# Patient Record
Sex: Female | Born: 1975 | Race: White | Hispanic: Yes | Marital: Married | Smoking: Never smoker
Health system: Southern US, Community
[De-identification: ages and names within clinical notes are randomized; demographics above are authoritative.]

## PROBLEM LIST (undated history)

## (undated) DIAGNOSIS — I10 Essential (primary) hypertension: Secondary | ICD-10-CM

## (undated) HISTORY — DX: Essential (primary) hypertension: I10

## (undated) HISTORY — PX: CHOLECYSTECTOMY: SHX55

## (undated) HISTORY — PX: HERNIA REPAIR: SHX51

---

## 2001-11-10 ENCOUNTER — Ambulatory Visit (HOSPITAL_COMMUNITY): Admission: RE | Admit: 2001-11-10 | Discharge: 2001-11-10 | Payer: Self-pay | Admitting: *Deleted

## 2002-03-08 ENCOUNTER — Inpatient Hospital Stay (HOSPITAL_COMMUNITY): Admission: AD | Admit: 2002-03-08 | Discharge: 2002-03-08 | Payer: Self-pay | Admitting: *Deleted

## 2002-03-09 ENCOUNTER — Inpatient Hospital Stay (HOSPITAL_COMMUNITY): Admission: AD | Admit: 2002-03-09 | Discharge: 2002-03-11 | Payer: Self-pay | Admitting: Obstetrics and Gynecology

## 2002-03-09 ENCOUNTER — Encounter (INDEPENDENT_AMBULATORY_CARE_PROVIDER_SITE_OTHER): Payer: Self-pay | Admitting: Specialist

## 2007-05-18 ENCOUNTER — Encounter: Admission: RE | Admit: 2007-05-18 | Discharge: 2007-05-18 | Payer: Self-pay | Admitting: Specialist

## 2008-11-27 ENCOUNTER — Ambulatory Visit (HOSPITAL_COMMUNITY): Admission: RE | Admit: 2008-11-27 | Discharge: 2008-11-27 | Payer: Self-pay | Admitting: Family Medicine

## 2009-03-26 ENCOUNTER — Encounter: Payer: Self-pay | Admitting: Obstetrics & Gynecology

## 2009-03-26 ENCOUNTER — Inpatient Hospital Stay (HOSPITAL_COMMUNITY): Admission: AD | Admit: 2009-03-26 | Discharge: 2009-03-28 | Payer: Self-pay | Admitting: Family Medicine

## 2009-03-26 ENCOUNTER — Ambulatory Visit: Payer: Self-pay | Admitting: Obstetrics and Gynecology

## 2009-07-25 ENCOUNTER — Observation Stay (HOSPITAL_COMMUNITY): Admission: EM | Admit: 2009-07-25 | Discharge: 2009-07-26 | Payer: Self-pay | Admitting: Emergency Medicine

## 2010-12-04 LAB — URINE MICROSCOPIC-ADD ON

## 2010-12-04 LAB — COMPREHENSIVE METABOLIC PANEL
ALT: 87 U/L — ABNORMAL HIGH (ref 0–35)
Albumin: 3.8 g/dL (ref 3.5–5.2)
Alkaline Phosphatase: 140 U/L — ABNORMAL HIGH (ref 39–117)
BUN: 10 mg/dL (ref 6–23)
Calcium: 9.4 mg/dL (ref 8.4–10.5)
Glucose, Bld: 124 mg/dL — ABNORMAL HIGH (ref 70–99)
Potassium: 3.5 mEq/L (ref 3.5–5.1)
Sodium: 139 mEq/L (ref 135–145)
Total Protein: 7.5 g/dL (ref 6.0–8.3)

## 2010-12-04 LAB — DIFFERENTIAL
Lymphs Abs: 2.4 10*3/uL (ref 0.7–4.0)
Monocytes Absolute: 0.5 10*3/uL (ref 0.1–1.0)
Monocytes Relative: 7 % (ref 3–12)
Neutro Abs: 4.5 10*3/uL (ref 1.7–7.7)
Neutrophils Relative %: 59 % (ref 43–77)

## 2010-12-04 LAB — URINALYSIS, ROUTINE W REFLEX MICROSCOPIC
Glucose, UA: NEGATIVE mg/dL
Nitrite: NEGATIVE
Specific Gravity, Urine: 1.017 (ref 1.005–1.030)
pH: 5 (ref 5.0–8.0)

## 2010-12-04 LAB — CBC
Hemoglobin: 14.4 g/dL (ref 12.0–15.0)
MCHC: 35.7 g/dL (ref 30.0–36.0)
Platelets: 202 10*3/uL (ref 150–400)
RDW: 13.3 % (ref 11.5–15.5)

## 2010-12-04 LAB — PROTIME-INR
INR: 0.87 (ref 0.00–1.49)
Prothrombin Time: 11.8 seconds (ref 11.6–15.2)

## 2010-12-08 LAB — RPR: RPR Ser Ql: NONREACTIVE

## 2010-12-08 LAB — CBC
Hemoglobin: 14.9 g/dL (ref 12.0–15.0)
RBC: 4.9 MIL/uL (ref 3.87–5.11)
WBC: 12 10*3/uL — ABNORMAL HIGH (ref 4.0–10.5)

## 2014-12-06 ENCOUNTER — Ambulatory Visit
Admission: RE | Admit: 2014-12-06 | Discharge: 2014-12-06 | Disposition: A | Discharge status: Home / Self Care | Payer: No Typology Code available for payment source | Source: Ambulatory Visit | Attending: Family Medicine | Admitting: Family Medicine

## 2014-12-06 ENCOUNTER — Other Ambulatory Visit: Payer: Self-pay | Admitting: Family Medicine

## 2014-12-06 DIAGNOSIS — M25562 Pain in left knee: Secondary | ICD-10-CM

## 2019-11-10 ENCOUNTER — Other Ambulatory Visit: Payer: Self-pay

## 2019-11-10 DIAGNOSIS — Z1231 Encounter for screening mammogram for malignant neoplasm of breast: Secondary | ICD-10-CM

## 2019-11-21 ENCOUNTER — Other Ambulatory Visit: Payer: Self-pay | Admitting: Nurse Practitioner

## 2019-11-21 DIAGNOSIS — Z1231 Encounter for screening mammogram for malignant neoplasm of breast: Secondary | ICD-10-CM

## 2019-12-13 ENCOUNTER — Ambulatory Visit
Admission: RE | Admit: 2019-12-13 | Discharge: 2019-12-13 | Disposition: A | Discharge status: Home / Self Care | Payer: No Typology Code available for payment source | Source: Ambulatory Visit | Attending: Nurse Practitioner | Admitting: Nurse Practitioner

## 2019-12-13 ENCOUNTER — Other Ambulatory Visit: Payer: Self-pay

## 2019-12-13 DIAGNOSIS — Z1231 Encounter for screening mammogram for malignant neoplasm of breast: Secondary | ICD-10-CM

## 2021-02-25 ENCOUNTER — Other Ambulatory Visit: Payer: Self-pay | Admitting: Obstetrics and Gynecology

## 2021-02-25 ENCOUNTER — Other Ambulatory Visit: Payer: Self-pay | Admitting: Obstetrics & Gynecology

## 2021-02-25 DIAGNOSIS — Z1231 Encounter for screening mammogram for malignant neoplasm of breast: Secondary | ICD-10-CM

## 2021-03-14 ENCOUNTER — Ambulatory Visit
Admission: RE | Admit: 2021-03-14 | Discharge: 2021-03-14 | Disposition: A | Discharge status: Home / Self Care | Payer: No Typology Code available for payment source | Source: Ambulatory Visit | Attending: Obstetrics & Gynecology | Admitting: Obstetrics & Gynecology

## 2021-03-14 ENCOUNTER — Other Ambulatory Visit: Payer: Self-pay

## 2021-03-14 DIAGNOSIS — Z1231 Encounter for screening mammogram for malignant neoplasm of breast: Secondary | ICD-10-CM

## 2021-03-18 ENCOUNTER — Other Ambulatory Visit: Payer: Self-pay | Admitting: Obstetrics and Gynecology

## 2021-03-18 DIAGNOSIS — R928 Other abnormal and inconclusive findings on diagnostic imaging of breast: Secondary | ICD-10-CM

## 2021-04-09 ENCOUNTER — Other Ambulatory Visit: Payer: Self-pay

## 2021-04-09 ENCOUNTER — Ambulatory Visit
Admission: RE | Admit: 2021-04-09 | Discharge: 2021-04-09 | Disposition: A | Discharge status: Home / Self Care | Payer: No Typology Code available for payment source | Source: Ambulatory Visit | Attending: Obstetrics and Gynecology | Admitting: Obstetrics and Gynecology

## 2021-04-09 ENCOUNTER — Other Ambulatory Visit: Payer: Self-pay | Admitting: Obstetrics and Gynecology

## 2021-04-09 ENCOUNTER — Ambulatory Visit: Payer: No Typology Code available for payment source | Admitting: *Deleted

## 2021-04-09 VITALS — BP 120/82 | Wt 175.2 lb

## 2021-04-09 DIAGNOSIS — N6489 Other specified disorders of breast: Secondary | ICD-10-CM

## 2021-04-09 DIAGNOSIS — R928 Other abnormal and inconclusive findings on diagnostic imaging of breast: Secondary | ICD-10-CM

## 2021-04-09 DIAGNOSIS — Z1239 Encounter for other screening for malignant neoplasm of breast: Secondary | ICD-10-CM

## 2021-04-09 NOTE — Patient Instructions (Signed)
Explained breast self awareness with Aliene Altes. Patient did not need a Pap smear today due to last Pap smear was in March 2021 per patient. Let her know BCCCP will cover Pap smears every 3 years unless has a history of abnormal Pap smears. Referred patient to the [facility] for a left breast diagnostic mammogram per recommendation. Appointment scheduled Tuesday, April 09, 2021 at 1400. Patient aware of appointment and will be there. Aliene Altes verbalized understanding.  Alixander Rallis, Kathaleen Maser, RN 11:26 AM

## 2021-04-09 NOTE — Progress Notes (Signed)
Ms. Becky Ruiz is a 45 y.o. female who presents to [facility] clinic today with no complaints. Patient referred to [facility] by the [facility] due to recommending additional imaging of the left breast. Screening mammogram completed 03/14/2021.   Pap Smear: Pap smear not completed today. Last Pap smear was in March 2021 at the [facility] clinic and was normal per patient. Per patient has no history of an abnormal Pap smear. Last Pap smear result is not available in Epic.   Physical exam: Breasts Breasts symmetrical. No skin abnormalities bilateral breasts. No nipple retraction bilateral breasts. No nipple discharge bilateral breasts. No lymphadenopathy. No lumps palpated bilateral breasts. No complaints of pain or tenderness on exam.      MS DIGITAL SCREENING TOMO BILATERAL  Result Date: 03/17/2021 CLINICAL DATA:  Screening. EXAM: DIGITAL SCREENING BILATERAL MAMMOGRAM WITH TOMOSYNTHESIS AND CAD TECHNIQUE: Bilateral screening digital craniocaudal and mediolateral oblique mammograms were obtained. Bilateral screening digital breast tomosynthesis was performed. The images were evaluated with computer-aided detection. COMPARISON:  Previous exam(s). ACR Breast Density Category d: The breast tissue is extremely dense, which lowers the sensitivity of mammography. FINDINGS: In the left breast, a possible asymmetry warrants further evaluation. In the right breast, no findings suspicious for malignancy. IMPRESSION: Further evaluation is suggested for possible asymmetry in the left breast. RECOMMENDATION: Diagnostic mammogram and possibly ultrasound of the left breast. (Code:FI-L-31M) The patient will be contacted regarding the findings, and additional imaging will be scheduled. BI-RADS CATEGORY  0: Incomplete. Need additional imaging evaluation and/or prior mammograms for comparison. Electronically Signed   By: Frederico Hamman M.D.   On: 03/17/2021 09:21   MS DIGITAL  SCREENING TOMO BILATERAL  Result Date: 12/13/2019 CLINICAL DATA:  Screening. EXAM: DIGITAL SCREENING BILATERAL MAMMOGRAM WITH TOMO AND CAD COMPARISON:  Previous exam(s). ACR Breast Density Category d: The breast tissue is extremely dense, which lowers the sensitivity of mammography FINDINGS: There are no findings suspicious for malignancy. Images were processed with CAD. IMPRESSION: No mammographic evidence of malignancy. A result letter of this screening mammogram will be mailed directly to the patient. RECOMMENDATION: Screening mammogram in one year. (Code:SM-B-01Y) BI-RADS CATEGORY  1: Negative. Electronically Signed   By: Sherian Rein M.D.   On: 12/13/2019 11:14    Pelvic/Bimanual Pap is not indicated today per BCCCP guidelines.   Smoking History: Patient has never smoked.   Patient Navigation: Patient education provided. Access to services provided for patient through [place] program. Spanish interpreter Alene Mires from [facility] provided.   Colorectal Cancer Screening: Per patient has never had colonoscopy completed. No complaints today.    Breast and Cervical Cancer Risk Assessment: Patient does not have family history of breast cancer, known genetic mutations, or radiation treatment to the chest before age 41. Patient does not have history of cervical dysplasia, immunocompromised, or DES exposure in-utero.  Risk Assessment     Risk Scores       04/09/2021   Last edited by: Narda Rutherford, LPN   5-year risk: 0.4 %   Lifetime risk: 4.8 %            A: BCCCP exam without pap smear No complaints.  P: Referred patient to the [facility] of [facility] for a left breast diagnostic mammogram per recommendation. Appointment scheduled Tuesday, April 09, 2021 at 1400.  Priscille Heidelberg, RN 04/09/2021 11:26 AM

## 2021-04-12 ENCOUNTER — Ambulatory Visit
Admission: RE | Admit: 2021-04-12 | Discharge: 2021-04-12 | Disposition: A | Discharge status: Home / Self Care | Payer: No Typology Code available for payment source | Source: Ambulatory Visit | Attending: Obstetrics and Gynecology | Admitting: Obstetrics and Gynecology

## 2021-04-12 ENCOUNTER — Other Ambulatory Visit: Payer: Self-pay

## 2021-04-12 DIAGNOSIS — N6489 Other specified disorders of breast: Secondary | ICD-10-CM

## 2021-04-12 HISTORY — PX: BREAST BIOPSY: SHX20

## 2022-04-07 ENCOUNTER — Other Ambulatory Visit: Payer: Self-pay | Admitting: Obstetrics and Gynecology

## 2022-04-07 DIAGNOSIS — Z1231 Encounter for screening mammogram for malignant neoplasm of breast: Secondary | ICD-10-CM

## 2022-06-05 ENCOUNTER — Ambulatory Visit
Admission: RE | Admit: 2022-06-05 | Discharge: 2022-06-05 | Disposition: A | Discharge status: Home / Self Care | Payer: No Typology Code available for payment source | Source: Ambulatory Visit | Attending: Obstetrics and Gynecology | Admitting: Obstetrics and Gynecology

## 2022-06-05 ENCOUNTER — Ambulatory Visit: Payer: Self-pay | Admitting: Hematology and Oncology

## 2022-06-05 ENCOUNTER — Ambulatory Visit: Payer: No Typology Code available for payment source

## 2022-06-05 VITALS — BP 130/90 | Wt 180.0 lb

## 2022-06-05 DIAGNOSIS — Z1231 Encounter for screening mammogram for malignant neoplasm of breast: Secondary | ICD-10-CM

## 2022-06-05 DIAGNOSIS — Z1211 Encounter for screening for malignant neoplasm of colon: Secondary | ICD-10-CM

## 2022-06-05 DIAGNOSIS — Z1239 Encounter for other screening for malignant neoplasm of breast: Secondary | ICD-10-CM

## 2022-06-05 NOTE — Progress Notes (Signed)
Ms. Becky Ruiz is a 46 y.o. female who presents to [facility] clinic today with no complaints .    Pap Smear: Pap not smear completed today. Last Pap smear was 03/21 at [facility] clinic and was normal. Per patient has no history of an abnormal Pap smear. Last Pap smear result is not available in Epic.   Physical exam: Breasts Breasts symmetrical. No skin abnormalities bilateral breasts. No nipple retraction bilateral breasts. No nipple discharge bilateral breasts. No lymphadenopathy. No lumps palpated bilateral breasts.       Pelvic/Bimanual Pap is not indicated today    Smoking History: Patient has never smoked and was not referred to quit line.    Patient Navigation: Patient education provided. Access to services provided for patient through [facility] program. [employer] interpreter provided. No transportation provided   Colorectal Cancer Screening: Per patient has never had colonoscopy completed No complaints today. FIT test given.   Breast and Cervical Cancer Risk Assessment: Patient does not have family history of breast cancer, known genetic mutations, or radiation treatment to the chest before age 85. Patient does not have history of cervical dysplasia, immunocompromised, or DES exposure in-utero.  Risk Assessment   No risk assessment data for the current encounter  Risk Scores       04/09/2021   Last edited by: Demetrius Revel, LPN   5-year risk: 0.4 %   Lifetime risk: 4.8 %            A: BCCCP exam without pap smear No complaints with benign exam.   P: Referred patient to the [place] for a screening mammogram. Appointment scheduled 06/05/22.  Melodye Ped, NP 06/05/2022 12:57 PM

## 2023-03-29 IMAGING — MG MM BREAST LOCALIZATION CLIP
4 series · 4 of 12 positions shown · non-contrast
Comparison: Previous exam(s).

CLINICAL DATA: Patient status post ultrasound-guided biopsy left
breast mass.

EXAM:
3D DIAGNOSTIC LEFT MAMMOGRAM POST ULTRASOUND BIOPSY

[L ML synth-2D]
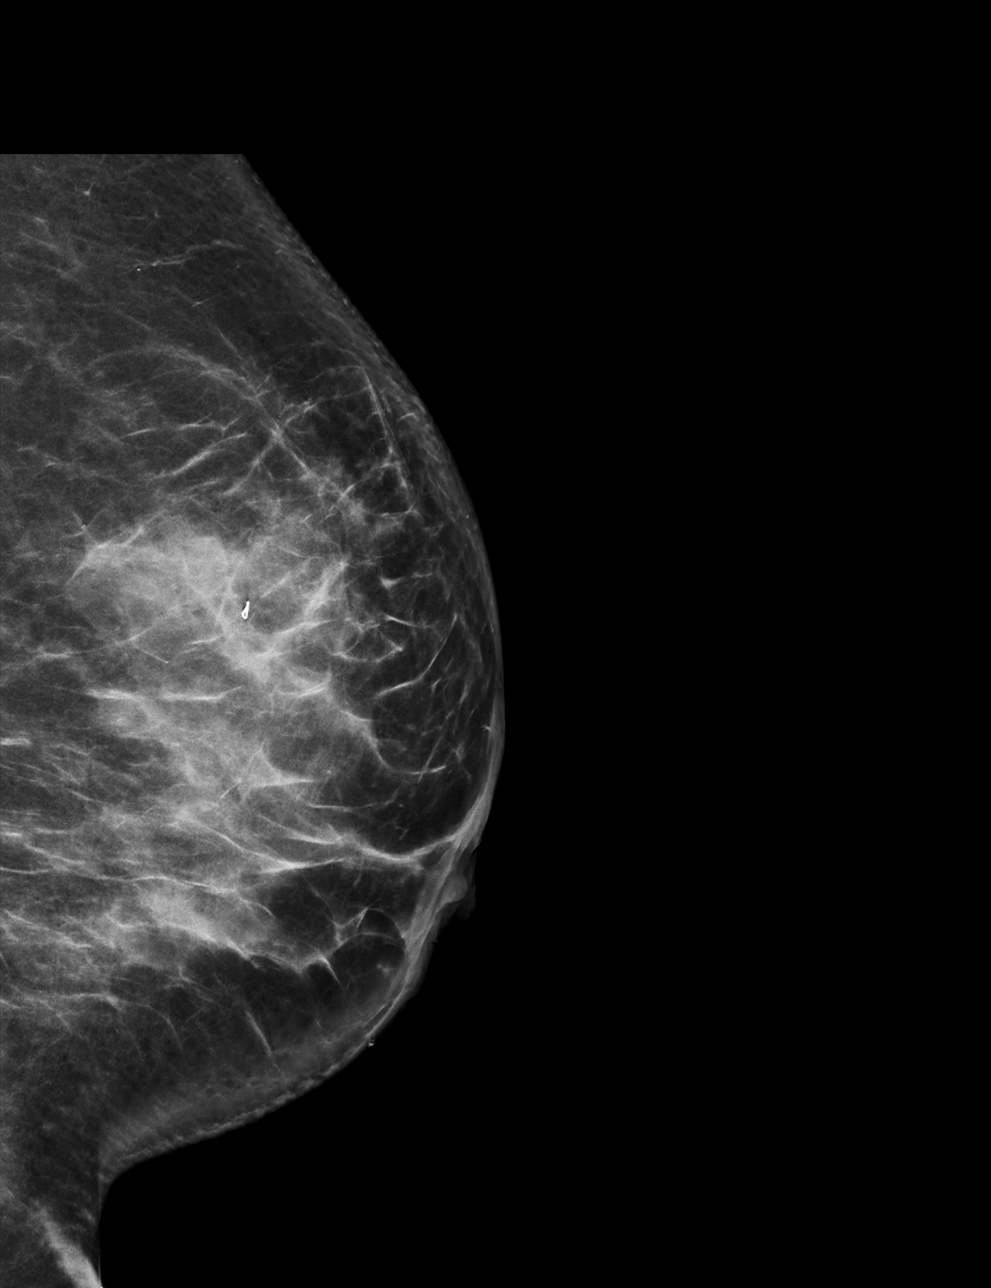

[L CC synth-2D]
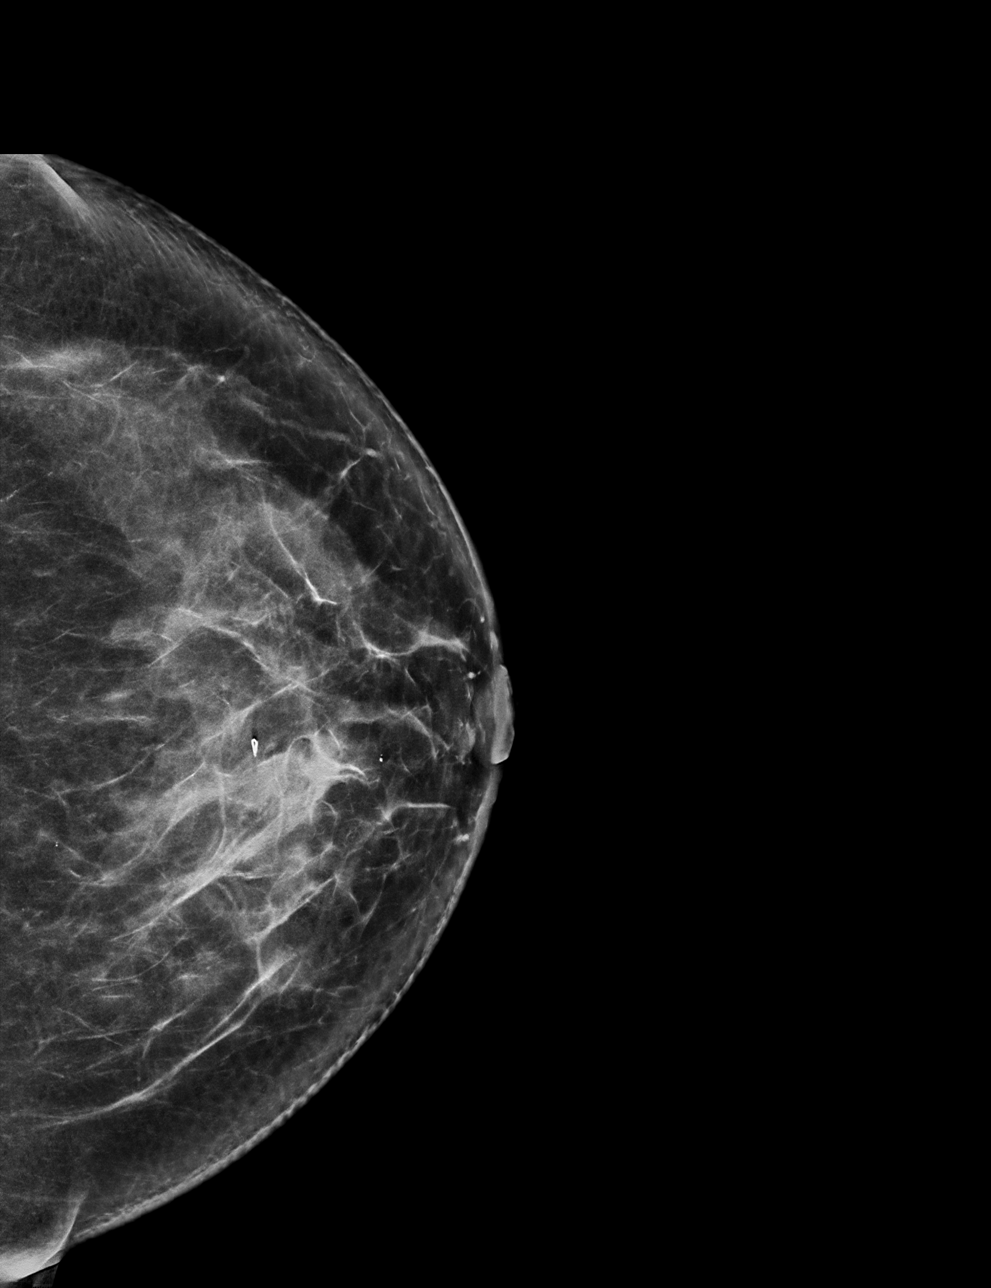

[L ML tomo · tomo slice 39/78.0]
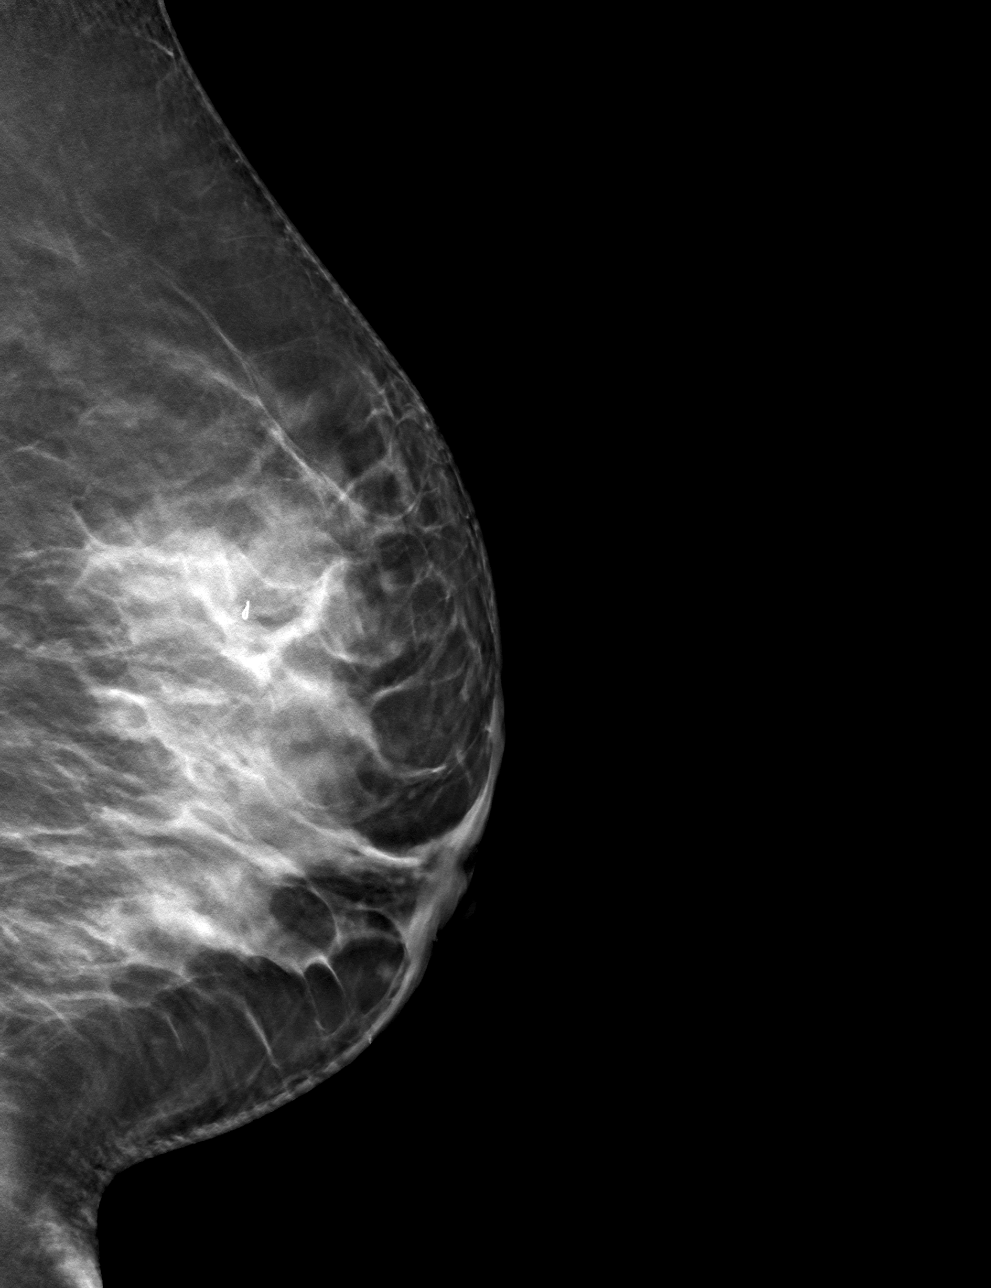

[L CC tomo · tomo slice 37/73.0]
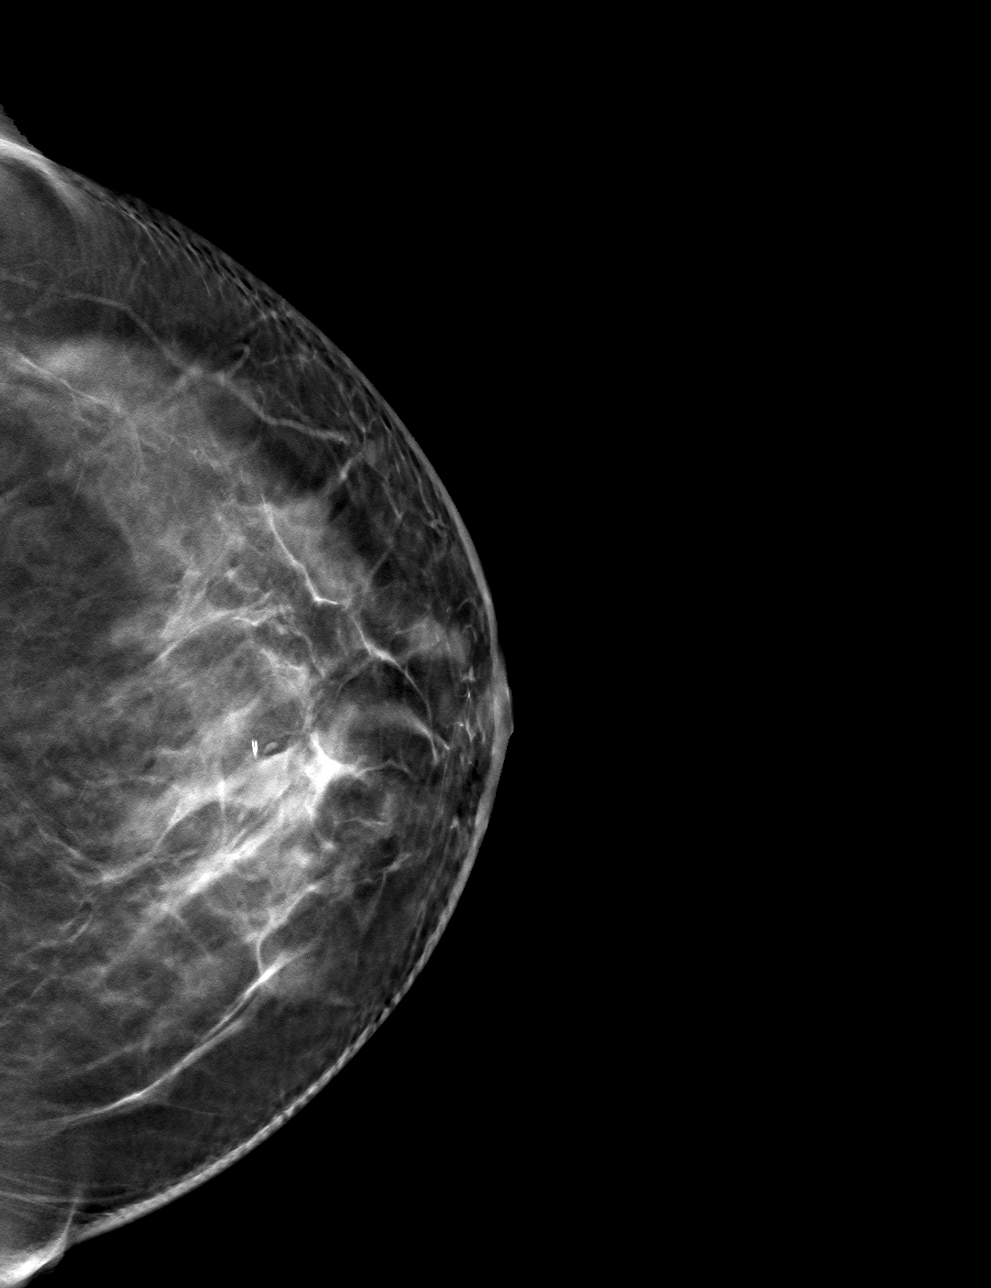

[4 of 12 positions shown; findings below may reference images not displayed]

FINDINGS: 3D Mammographic images were obtained following ultrasound guided
biopsy of left breast mass 12 o'clock position. The biopsy marking
clip is in expected position at the site of biopsy.
IMPRESSION: Appropriate positioning of the ribbon shaped biopsy marking clip at
the site of biopsy in the left breast 12 o'clock position.

Final Assessment: Post Procedure Mammograms for Marker Placement

## 2023-05-25 ENCOUNTER — Ambulatory Visit (INDEPENDENT_AMBULATORY_CARE_PROVIDER_SITE_OTHER): Payer: Self-pay | Admitting: Nurse Practitioner

## 2023-05-25 ENCOUNTER — Encounter: Payer: Self-pay | Admitting: Nurse Practitioner

## 2023-05-25 VITALS — BP 127/59 | HR 77 | Temp 97.2°F | Ht 60.0 in | Wt 171.6 lb

## 2023-05-25 DIAGNOSIS — Z1329 Encounter for screening for other suspected endocrine disorder: Secondary | ICD-10-CM

## 2023-05-25 DIAGNOSIS — Z130 Encounter for screening for diseases of the blood and blood-forming organs and certain disorders involving the immune mechanism: Secondary | ICD-10-CM

## 2023-05-25 DIAGNOSIS — I10 Essential (primary) hypertension: Secondary | ICD-10-CM | POA: Insufficient documentation

## 2023-05-25 DIAGNOSIS — Z13228 Encounter for screening for other metabolic disorders: Secondary | ICD-10-CM

## 2023-05-25 DIAGNOSIS — Z1321 Encounter for screening for nutritional disorder: Secondary | ICD-10-CM

## 2023-05-25 DIAGNOSIS — Z1231 Encounter for screening mammogram for malignant neoplasm of breast: Secondary | ICD-10-CM

## 2023-05-25 DIAGNOSIS — N951 Menopausal and female climacteric states: Secondary | ICD-10-CM | POA: Insufficient documentation

## 2023-05-25 MED ORDER — LISINOPRIL 20 MG PO TABS: 20.0000 mg | ORAL_TABLET | Freq: Every day | ORAL | 1 refills | Status: DC

## 2023-05-25 NOTE — Patient Instructions (Addendum)
Please get your influenza and TDAP vaccine at the pharmacy .   1. Primary hypertension  - lisinopril (ZESTRIL) 20 MG tablet; Take 1 tablet (20 mg total) by mouth daily.  Dispense: 90 tablet; Refill: 1  2. Screening mammogram for breast cancer  - MM Digital Screening; Future   3. Screening for endocrine, nutritional, metabolic and immunity disorder  - CMP14+EGFR - Hemoglobin A1c - Lipid panel    It is important that you exercise regularly at least 30 minutes 5 times a week as tolerated  Think about what you will eat, plan ahead. Choose " clean, green, fresh or frozen" over canned, processed or packaged foods which are more sugary, salty and fatty. 70 to 75% of food eaten should be vegetables and fruit. Three meals at set times with snacks allowed between meals, but they must be fruit or vegetables. Aim to eat over a 12 hour period , example 7 am to 7 pm, and STOP after  your last meal of the day. Drink water,generally about 64 ounces per day, no other drink is as healthy. Fruit juice is best enjoyed in a healthy way, by EATING the fruit.  Thanks for choosing [facility] we consider it a privelige to serve you.

## 2023-05-25 NOTE — Assessment & Plan Note (Signed)
Patient reports irregular menstrual cycles She denies any other complaints I explained to the patient that this is most likely perimenopausal symptoms

## 2023-05-25 NOTE — Assessment & Plan Note (Signed)
BP Readings from Last 3 Encounters:  05/25/23 (!) 127/59  06/05/22 (!) 130/90  04/09/21 120/82   HTN Controlled with on lisinopril 20 mg daily Patient denies chest pain, dizziness, edema States that she has been following a heart healthy diet and does walking exercises on some days, she also goes to the gym sometimes Continue current medications. No changes in management. Discussed DASH diet and dietary sodium restrictions Continue to increase dietary efforts and exercise.  CMP today

## 2023-05-25 NOTE — Progress Notes (Signed)
New Patient Office Visit  Subjective:  Patient ID: Becky Ruiz, female    DOB: [birth year]  Age: 47 y.o. MRN: [id number]  CC:  Chief Complaint  Patient presents with   Establish Care   Hypertension    HPI Becky Ruiz is a 47 y.o. female with past medical history of hypertension, presents for establishing care.  Previous PCP [facility] at [facility]. Last seen in March 2024.  She stated that the clinic is now closed down.  Last Pap smear was in February, had a Pap smear done at the health department.  She reports irregular menses recently.  No abdominal pain, nausea, vomiting, pelvic pain, hot flashes. records requested from the health department today  Patient encouraged to get her flu vaccine and Tdap vaccine at the pharmacy  Patient denies any complaints today  She is accompanied by a Spanish medical interpreter who assisted with interpretation    Past Medical History:  Diagnosis Date   Hypertension     Past Surgical History:  Procedure Laterality Date   BREAST BIOPSY Left 04/12/2021   fibroadenoma   CHOLECYSTECTOMY     HERNIA REPAIR      Family History  Problem Relation Age of Onset   Breast cancer Neg Hx     Social History   Socioeconomic History   Marital status: Married    Spouse name: Not on file   Number of children: 4   Years of education: Not on file   Highest education level: 6th grade  Occupational History   Not on file  Tobacco Use   Smoking status: Never   Smokeless tobacco: Never  Vaping Use   Vaping status: Never Used  Substance and Sexual Activity   Alcohol use: Yes    Comment: rarely   Drug use: Never   Sexual activity: Yes    Birth control/protection: None  Other Topics Concern   Not on file  Social History Narrative   Lives with her spouse    Social Determinants of Health   Financial Resource Strain: Not on file  Food Insecurity: No Food Insecurity (06/05/2022)   Hunger Vital Sign     Worried About Running Out of Food in the Last Year: Never true    Ran Out of Food in the Last Year: Never true  Transportation Needs: No Transportation Needs (06/05/2022)   PRAPARE - Administrator, Civil Service (Medical): No    Lack of Transportation (Non-Medical): No  Physical Activity: Not on file  Stress: Not on file  Social Connections: Not on file  Intimate Partner Violence: Not on file    ROS Review of Systems  Constitutional: Negative.   HENT: Negative.    Eyes: Negative.   Respiratory: Negative.    Cardiovascular: Negative.   Gastrointestinal: Negative.   Endocrine: Negative.   Genitourinary: Negative.  Negative for difficulty urinating, dyspareunia, dysuria, enuresis and flank pain.  Musculoskeletal: Negative.   Skin: Negative.   Allergic/Immunologic: Negative.   Neurological: Negative.   Psychiatric/Behavioral: Negative.      Objective:   Today's Vitals: BP (!) 127/59   Pulse 77   Temp (!) 97.2 F (36.2 C)   Ht 5' (1.524 m)   Wt 171 lb 9.6 oz (77.8 kg)   SpO2 100%   BMI 33.51 kg/m   Physical Exam Vitals and nursing note reviewed.  Constitutional:      General: She is not in acute distress.    Appearance: Normal appearance. She  is obese. She is not ill-appearing, toxic-appearing or diaphoretic.  HENT:     Mouth/Throat:     Mouth: Mucous membranes are moist.     Pharynx: Oropharynx is clear. No oropharyngeal exudate or posterior oropharyngeal erythema.  Eyes:     General: No scleral icterus.       Right eye: No discharge.        Left eye: No discharge.     Extraocular Movements: Extraocular movements intact.     Conjunctiva/sclera: Conjunctivae normal.  Cardiovascular:     Rate and Rhythm: Normal rate and regular rhythm.     Pulses: Normal pulses.     Heart sounds: Normal heart sounds. No murmur heard.    No friction rub. No gallop.  Pulmonary:     Effort: Pulmonary effort is normal. No respiratory distress.     Breath sounds: Normal  breath sounds. No stridor. No wheezing, rhonchi or rales.  Chest:     Chest wall: No tenderness.  Abdominal:     General: There is no distension.     Palpations: Abdomen is soft.     Tenderness: There is no abdominal tenderness. There is no right CVA tenderness, left CVA tenderness or guarding.  Musculoskeletal:        General: No swelling, tenderness, deformity or signs of injury.     Right lower leg: No edema.     Left lower leg: No edema.  Skin:    General: Skin is warm and dry.     Capillary Refill: Capillary refill takes less than 2 seconds.     Coloration: Skin is not jaundiced or pale.     Findings: No bruising, erythema or lesion.  Neurological:     Mental Status: She is alert and oriented to person, place, and time.     Motor: No weakness.     Coordination: Coordination normal.     Gait: Gait normal.  Psychiatric:        Mood and Affect: Mood normal.        Behavior: Behavior normal.        Thought Content: Thought content normal.        Judgment: Judgment normal.     Assessment & Plan:   Problem List Items Addressed This Visit       Cardiovascular and Mediastinum   High blood pressure - Primary    BP Readings from Last 3 Encounters:  05/25/23 (!) 127/59  06/05/22 (!) 130/90  04/09/21 120/82   HTN Controlled with on lisinopril 20 mg daily Patient denies chest pain, dizziness, edema States that she has been following a heart healthy diet and does walking exercises on some days, she also goes to the gym sometimes Continue current medications. No changes in management. Discussed DASH diet and dietary sodium restrictions Continue to increase dietary efforts and exercise.  CMP today      Relevant Medications   lisinopril (ZESTRIL) 20 MG tablet     Other   Perimenopausal symptoms    Patient reports irregular menstrual cycles She denies any other complaints I explained to the patient that this is most likely perimenopausal symptoms      Other Visit  Diagnoses     Screening mammogram for breast cancer       Relevant Orders   MM Digital Screening   Screening for endocrine, nutritional, metabolic and immunity disorder       Relevant Orders   CMP14+EGFR   Hemoglobin A1c   Lipid panel  Outpatient Encounter Medications as of 05/25/2023  Medication Sig   [DISCONTINUED] lisinopril (ZESTRIL) 20 MG tablet Take 20 mg by mouth daily.   lisinopril (ZESTRIL) 20 MG tablet Take 1 tablet (20 mg total) by mouth daily.   No facility-administered encounter medications on file as of 05/25/2023.    Follow-up: Return in about 6 months (around 11/22/2023) for HTN.   Donell Beers, FNP

## 2023-05-26 LAB — CMP14+EGFR
ALT: 26 IU/L (ref 0–32)
AST: 23 IU/L (ref 0–40)
Albumin: 4.3 g/dL (ref 3.9–4.9)
Alkaline Phosphatase: 92 IU/L (ref 44–121)
BUN/Creatinine Ratio: 18 (ref 9–23)
BUN: 11 mg/dL (ref 6–24)
Bilirubin Total: 0.4 mg/dL (ref 0.0–1.2)
CO2: 22 mmol/L (ref 20–29)
Calcium: 9 mg/dL (ref 8.7–10.2)
Chloride: 103 mmol/L (ref 96–106)
Creatinine, Ser: 0.61 mg/dL (ref 0.57–1.00)
Globulin, Total: 2.7 g/dL (ref 1.5–4.5)
Glucose: 102 mg/dL — ABNORMAL HIGH (ref 70–99)
Potassium: 4.3 mmol/L (ref 3.5–5.2)
Sodium: 140 mmol/L (ref 134–144)
Total Protein: 7 g/dL (ref 6.0–8.5)
eGFR: 110 mL/min/{1.73_m2} (ref >59)

## 2023-05-26 LAB — LIPID PANEL
Chol/HDL Ratio: 5.3 ratio — ABNORMAL HIGH (ref 0.0–4.4)
Cholesterol, Total: 216 mg/dL — ABNORMAL HIGH (ref 100–199)
HDL: 41 mg/dL (ref >39)
LDL Chol Calc (NIH): 149 mg/dL — ABNORMAL HIGH (ref 0–99)
Triglycerides: 146 mg/dL (ref 0–149)
VLDL Cholesterol Cal: 26 mg/dL (ref 5–40)

## 2023-05-26 LAB — HEMOGLOBIN A1C
Est. average glucose Bld gHb Est-mCnc: 117 mg/dL
Hgb A1c MFr Bld: 5.7 % — ABNORMAL HIGH (ref 4.8–5.6)

## 2023-06-22 ENCOUNTER — Ambulatory Visit: Payer: No Typology Code available for payment source | Admitting: Nurse Practitioner

## 2023-07-14 ENCOUNTER — Other Ambulatory Visit: Payer: Self-pay | Admitting: Obstetrics and Gynecology

## 2023-07-14 DIAGNOSIS — Z1231 Encounter for screening mammogram for malignant neoplasm of breast: Secondary | ICD-10-CM

## 2023-07-24 ENCOUNTER — Ambulatory Visit: Payer: No Typology Code available for payment source

## 2023-09-17 ENCOUNTER — Ambulatory Visit
Admission: RE | Admit: 2023-09-17 | Discharge: 2023-09-17 | Disposition: A | Discharge status: Home / Self Care | Payer: No Typology Code available for payment source | Source: Ambulatory Visit | Attending: Obstetrics and Gynecology | Admitting: Obstetrics and Gynecology

## 2023-09-17 ENCOUNTER — Other Ambulatory Visit: Payer: Self-pay

## 2023-09-17 ENCOUNTER — Ambulatory Visit: Payer: Self-pay | Admitting: *Deleted

## 2023-09-17 VITALS — BP 132/80 | Wt 155.0 lb

## 2023-09-17 DIAGNOSIS — Z1239 Encounter for other screening for malignant neoplasm of breast: Secondary | ICD-10-CM

## 2023-09-17 DIAGNOSIS — Z1211 Encounter for screening for malignant neoplasm of colon: Secondary | ICD-10-CM

## 2023-09-17 DIAGNOSIS — Z1231 Encounter for screening mammogram for malignant neoplasm of breast: Secondary | ICD-10-CM

## 2023-09-17 NOTE — Progress Notes (Signed)
Ms. Becky Ruiz is a 48 y.o. female who presents to [facility] clinic today with no complaints.    Pap Smear: Pap smear not completed today. Last Pap smear was in February 2024 at the [facility] clinic and was normal per patient. Per patient has no history of an abnormal Pap smear. Last Pap smear result is not available in Epic.    Physical exam: Breasts Breasts symmetrical. No skin abnormalities bilateral breasts. No nipple retraction bilateral breasts. No nipple discharge bilateral breasts. No lymphadenopathy. No lumps palpated bilateral breasts. No complaints of pain or tenderness on exam.       MS DIGITAL SCREENING TOMO BILATERAL Result Date: 06/06/2022 CLINICAL DATA:  Screening. EXAM: DIGITAL SCREENING BILATERAL MAMMOGRAM WITH TOMOSYNTHESIS AND CAD TECHNIQUE: Bilateral screening digital craniocaudal and mediolateral oblique mammograms were obtained. Bilateral screening digital breast tomosynthesis was performed. The images were evaluated with computer-aided detection. COMPARISON:  Previous exam(s). ACR Breast Density Category c: The breast tissue is heterogeneously dense, which may obscure small masses. FINDINGS: There are no findings suspicious for malignancy. IMPRESSION: No mammographic evidence of malignancy. A result letter of this screening mammogram will be mailed directly to the patient. RECOMMENDATION: Screening mammogram in one year. (Code:SM-B-01Y) BI-RADS CATEGORY  1: Negative. Electronically Signed   By: Elberta Fortis M.D.   On: 06/06/2022 16:52   MM CLIP PLACEMENT LEFT Result Date: 04/12/2021 CLINICAL DATA:  Patient status post ultrasound-guided biopsy left breast mass. EXAM: 3D DIAGNOSTIC LEFT MAMMOGRAM POST ULTRASOUND BIOPSY COMPARISON:  Previous exam(s). FINDINGS: 3D Mammographic images were obtained following ultrasound guided biopsy of left breast mass 12 o'clock position. The biopsy marking clip is in expected position at the site of biopsy.  IMPRESSION: Appropriate positioning of the ribbon shaped biopsy marking clip at the site of biopsy in the left breast 12 o'clock position. Final Assessment: Post Procedure Mammograms for Marker Placement Electronically Signed   By: Annia Belt M.D.   On: 04/12/2021 11:17  MM DIAG BREAST TOMO UNI LEFT Result Date: 04/10/2021 CLINICAL DATA:  Patient presents for evaluation of possible LEFT breast asymmetry after screening mammogram. EXAM: DIGITAL DIAGNOSTIC UNILATERAL LEFT MAMMOGRAM WITH TOMOSYNTHESIS AND CAD; ULTRASOUND LEFT BREAST LIMITED TECHNIQUE: Left digital diagnostic mammography and breast tomosynthesis was performed. The images were evaluated with computer-aided detection.; Targeted ultrasound examination of the left breast was performed. COMPARISON:  Previous exam(s). ACR Breast Density Category c: The breast tissue is heterogeneously dense, which may obscure small masses. FINDINGS: Additional 2-D and 3-D images are performed. These views from presence of focal asymmetry in the UPPER portion of the LEFT breast. On physical exam, I palpate no abnormality UPPER central LEFT breast. Targeted ultrasound is performed, showing a group of hypoechoic masses in the 12 o'clock location of the LEFT breast 3 centimeters from the nipple which measures 1.4 x 1.5 x 0.9 centimeters. There is no associated internal blood flow. Posterior acoustic enhancement is present. Evaluation of the LEFT axilla is negative for adenopathy. IMPRESSION: Indeterminate group of hypoechoic masses in the 12 o'clock location of the LEFT breast warranting biopsy. RECOMMENDATION: Recommend ultrasound-guided core biopsy of the LEFT breast. I have discussed the findings and recommendations with the patient with the assistance of an interpreter. If applicable, a reminder letter will be sent to the patient regarding the next appointment. BI-RADS CATEGORY  4: Suspicious. Electronically Signed   By: Norva Pavlov M.D.   On: 04/10/2021 08:59  MS  DIGITAL SCREENING TOMO BILATERAL Result Date: 03/17/2021 CLINICAL DATA:  Screening. EXAM:  DIGITAL SCREENING BILATERAL MAMMOGRAM WITH TOMOSYNTHESIS AND CAD TECHNIQUE: Bilateral screening digital craniocaudal and mediolateral oblique mammograms were obtained. Bilateral screening digital breast tomosynthesis was performed. The images were evaluated with computer-aided detection. COMPARISON:  Previous exam(s). ACR Breast Density Category d: The breast tissue is extremely dense, which lowers the sensitivity of mammography. FINDINGS: In the left breast, a possible asymmetry warrants further evaluation. In the right breast, no findings suspicious for malignancy. IMPRESSION: Further evaluation is suggested for possible asymmetry in the left breast. RECOMMENDATION: Diagnostic mammogram and possibly ultrasound of the left breast. (Code:FI-L-79M) The patient will be contacted regarding the findings, and additional imaging will be scheduled. BI-RADS CATEGORY  0: Incomplete. Need additional imaging evaluation and/or prior mammograms for comparison. Electronically Signed   By: Frederico Hamman M.D.   On: 03/17/2021 09:21   MS DIGITAL SCREENING TOMO BILATERAL Result Date: 12/13/2019 CLINICAL DATA:  Screening. EXAM: DIGITAL SCREENING BILATERAL MAMMOGRAM WITH TOMO AND CAD COMPARISON:  Previous exam(s). ACR Breast Density Category d: The breast tissue is extremely dense, which lowers the sensitivity of mammography FINDINGS: There are no findings suspicious for malignancy. Images were processed with CAD. IMPRESSION: No mammographic evidence of malignancy. A result letter of this screening mammogram will be mailed directly to the patient. RECOMMENDATION: Screening mammogram in one year. (Code:SM-B-01Y) BI-RADS CATEGORY  1: Negative. Electronically Signed   By: Sherian Rein M.D.   On: 12/13/2019 11:14    Pelvic/Bimanual Pap is not indicated today per BCCCP guidelines.   Smoking History: Patient has never smoked.   Patient  Navigation: Patient education provided. Access to services provided for patient through [place] program. Spanish interpreter Natale Lay from [facility] provided.   Colorectal Cancer Screening: Per patient has never had colonoscopy completed. FIT Test completed 06/05/2022 that was negative. FIT Test given to patient today to complete. No complaints today.    Breast and Cervical Cancer Risk Assessment: Patient does not have family history of breast cancer, known genetic mutations, or radiation treatment to the chest before age 87. Patient does not have history of cervical dysplasia, immunocompromised, or DES exposure in-utero.  Risk Scores as of Encounter on 09/17/2023     Dondra Spry           5-year 0.59%   Lifetime 6.31%            Last calculated by Caprice Red, CMA on 09/17/2023 at  2:51 PM        A: BCCCP exam without pap smear No complaints.  P: Referred patient to the [facility] of [facility] for a screening mammogram on mobile unit. Appointment scheduled Thursday, September 17, 2023 at 1540.  Priscille Heidelberg, RN 09/17/2023 2:55 PM

## 2023-09-17 NOTE — Patient Instructions (Signed)
Explained breast self awareness with Aliene Altes. Patient did not need a Pap smear today due to last Pap smear was in February 2024 per patient. Let her know BCCCP will cover Pap smears every 3 years unless has a history of abnormal Pap smears. Referred patient to the [facility] for a screening mammogram on mobile unit. Appointment scheduled Thursday, September 17, 2023 at 1540. Patient aware of appointment and will be there. Let patient know the Breast Center will follow up with her within the next couple weeks with results of her mammogram by letter or phone. Aliene Altes verbalized understanding.  Zeph Riebel, Kathaleen Maser, RN 2:55 PM

## 2023-11-13 ENCOUNTER — Other Ambulatory Visit: Payer: Self-pay | Admitting: Nurse Practitioner

## 2023-11-13 DIAGNOSIS — I10 Essential (primary) hypertension: Secondary | ICD-10-CM

## 2023-11-23 ENCOUNTER — Encounter: Payer: Self-pay | Admitting: Nurse Practitioner

## 2023-11-23 ENCOUNTER — Ambulatory Visit (INDEPENDENT_AMBULATORY_CARE_PROVIDER_SITE_OTHER): Payer: Self-pay | Admitting: Nurse Practitioner

## 2023-11-23 VITALS — BP 143/62 | HR 72 | Temp 98.4°F | Wt 173.4 lb

## 2023-11-23 DIAGNOSIS — R7303 Prediabetes: Secondary | ICD-10-CM

## 2023-11-23 DIAGNOSIS — E785 Hyperlipidemia, unspecified: Secondary | ICD-10-CM | POA: Insufficient documentation

## 2023-11-23 DIAGNOSIS — I10 Essential (primary) hypertension: Secondary | ICD-10-CM

## 2023-11-23 DIAGNOSIS — E782 Mixed hyperlipidemia: Secondary | ICD-10-CM | POA: Insufficient documentation

## 2023-11-23 DIAGNOSIS — E669 Obesity, unspecified: Secondary | ICD-10-CM

## 2023-11-23 NOTE — Assessment & Plan Note (Addendum)
 Systolic blood pressure is elevated in the office today, but has been previously well-controlled.  Continue lisinopril 20 mg daily Blood pressure check in 1 month, DASH diet and commitment to daily physical activity for a minimum of 30 minutes discussed and encouraged, as a part of hypertension management. The importance of attaining a healthy weight is also discussed.     11/23/2023    8:40 AM 11/23/2023    8:25 AM 11/23/2023    8:21 AM 09/17/2023    2:44 PM 05/25/2023    8:59 AM 06/05/2022   12:59 PM 04/09/2021   11:11 AM  BP/Weight  Systolic BP 143 143 145 132 127 130 120  Diastolic BP 62 65 89 80 59 90 82  Wt. (Lbs)   173.4 155 171.6 180 175.2  BMI   33.86 kg/m2 30.27 kg/m2 33.51 kg/m2

## 2023-11-23 NOTE — Assessment & Plan Note (Signed)
 Lab Results  Component Value Date   HGBA1C 5.7 (H) 05/25/2023

## 2023-11-23 NOTE — Assessment & Plan Note (Addendum)
 Wt Readings from Last 3 Encounters:  11/23/23 173 lb 6.4 oz (78.7 kg)  09/17/23 155 lb (70.3 kg)  05/25/23 171 lb 9.6 oz (77.8 kg)   Body mass index is 33.86 kg/m.  Patient counseled on low-carb diet Encouraged engage in regular moderate exercises at least 150 minutes weekly as tolerated Benefits of healthy weights discussed

## 2023-11-23 NOTE — Assessment & Plan Note (Addendum)
 Lab Results  Component Value Date   CHOL 216 (H) 05/25/2023   HDL 41 05/25/2023   LDLCALC 149 (H) 05/25/2023   TRIG 146 05/25/2023   CHOLHDL 5.3 (H) 05/25/2023  Avoid fatty fried foods, lose weight

## 2023-11-23 NOTE — Patient Instructions (Signed)
 Please consider getting Influenza , Shingrix and Tdap vaccine at local pharmacy.    Around 3 times per week, check your blood pressure 2 times per day. once in the morning and once in the evening. The readings should be at least one minute apart. Write down these values and bring them to your next nurse visit/appointment.  When you check your BP, make sure you have been doing something calm/relaxing 5 minutes prior to checking. Both feet should be flat on the floor and you should be sitting. Use your left arm and make sure it is in a relaxed position (on a table), and that the cuff is at the approximate level/height of your heart.    It is important that you exercise regularly at least 30 minutes 5 times a week as tolerated  Think about what you will eat, plan ahead. Choose " clean, green, fresh or frozen" over canned, processed or packaged foods which are more sugary, salty and fatty. 70 to 75% of food eaten should be vegetables and fruit. Three meals at set times with snacks allowed between meals, but they must be fruit or vegetables. Aim to eat over a 12 hour period , example 7 am to 7 pm, and STOP after  your last meal of the day. Drink water,generally about 64 ounces per day, no other drink is as healthy. Fruit juice is best enjoyed in a healthy way, by EATING the fruit.  Thanks for choosing [facility] we consider it a privelige to serve you.

## 2023-11-23 NOTE — Progress Notes (Signed)
 Established Patient Office Visit  Subjective:  Patient ID: Becky Ruiz, female    DOB: 06/01/1976  Age: 48 y.o. MRN: [id number]  CC: No chief complaint on file.   HPI Becky Ruiz is a 48 y.o. female  has a past medical history of Hypertension.  Patient presents for follow-up for her chronic medical conditions  Hypertension.  Currently on lisinopril 20 mg daily, patient denies chest pain, shortness of breath, edema   Had a mammogram done through the [facility] , mammogram was normal, she was given a fecal occult blood card test for colon cancer, states that she has not completed the test, she was encouraged to completed the test as ordered   Patient encouraged to get influenza vaccine, shingles vaccine and Tdap vaccine at the pharmacy  Interpretation services provided by medical interpreter       Past Medical History:  Diagnosis Date   Hypertension     Past Surgical History:  Procedure Laterality Date   BREAST BIOPSY Left 04/12/2021   fibroadenoma   CHOLECYSTECTOMY     HERNIA REPAIR      Family History  Problem Relation Age of Onset   Breast cancer Neg Hx     Social History   Socioeconomic History   Marital status: Married    Spouse name: Not on file   Number of children: 4   Years of education: Not on file   Highest education level: 6th grade  Occupational History   Not on file  Tobacco Use   Smoking status: Never   Smokeless tobacco: Never  Vaping Use   Vaping status: Never Used  Substance and Sexual Activity   Alcohol use: Yes    Comment: rarely   Drug use: Never   Sexual activity: Yes    Birth control/protection: Condom  Other Topics Concern   Not on file  Social History Narrative   Pt was born in [place]   Lives with her spouse    Social Drivers of Health   Financial Resource Strain: Not on file  Food Insecurity: No Food Insecurity (09/17/2023)   Hunger Vital Sign    Worried About Running Out of Food in the Last  Year: Never true    Ran Out of Food in the Last Year: Never true  Transportation Needs: No Transportation Needs (09/17/2023)   PRAPARE - Administrator, Civil Service (Medical): No    Lack of Transportation (Non-Medical): No  Physical Activity: Not on file  Stress: Not on file  Social Connections: Not on file  Intimate Partner Violence: Not on file    Outpatient Medications Prior to Visit  Medication Sig Dispense Refill   lisinopril (ZESTRIL) 20 MG tablet Take 1 tablet by mouth once daily 90 tablet 0   No facility-administered medications prior to visit.    No Known Allergies  ROS Review of Systems  Constitutional:  Negative for appetite change, chills, fatigue and fever.  HENT:  Negative for congestion, postnasal drip, rhinorrhea and sneezing.   Respiratory:  Negative for cough, shortness of breath and wheezing.   Cardiovascular:  Negative for chest pain, palpitations and leg swelling.  Gastrointestinal:  Negative for abdominal pain, constipation, nausea and vomiting.  Genitourinary:  Negative for difficulty urinating, dysuria, flank pain and frequency.  Musculoskeletal:  Negative for arthralgias, back pain, joint swelling and myalgias.  Skin:  Negative for color change, pallor, rash and wound.  Neurological:  Negative for dizziness, facial asymmetry, weakness, numbness and headaches.  Psychiatric/Behavioral:  Negative for behavioral problems, confusion, self-injury and suicidal ideas.       Objective:    Physical Exam Vitals and nursing note reviewed.  Constitutional:      General: She is not in acute distress.    Appearance: Normal appearance. She is obese. She is not ill-appearing, toxic-appearing or diaphoretic.  HENT:     Mouth/Throat:     Mouth: Mucous membranes are moist.     Pharynx: Oropharynx is clear. No oropharyngeal exudate or posterior oropharyngeal erythema.  Eyes:     General: No scleral icterus.       Right eye: No discharge.        Left  eye: No discharge.     Extraocular Movements: Extraocular movements intact.     Conjunctiva/sclera: Conjunctivae normal.  Cardiovascular:     Rate and Rhythm: Normal rate and regular rhythm.     Pulses: Normal pulses.     Heart sounds: Normal heart sounds. No murmur heard.    No friction rub. No gallop.  Pulmonary:     Effort: Pulmonary effort is normal. No respiratory distress.     Breath sounds: Normal breath sounds. No stridor. No wheezing, rhonchi or rales.  Chest:     Chest wall: No tenderness.  Abdominal:     General: There is no distension.     Palpations: Abdomen is soft.     Tenderness: There is no abdominal tenderness. There is no right CVA tenderness, left CVA tenderness or guarding.  Musculoskeletal:        General: No swelling, tenderness, deformity or signs of injury.     Right lower leg: No edema.     Left lower leg: No edema.  Skin:    General: Skin is warm and dry.     Capillary Refill: Capillary refill takes less than 2 seconds.     Coloration: Skin is not jaundiced or pale.     Findings: No bruising, erythema or lesion.  Neurological:     Mental Status: She is alert and oriented to person, place, and time.     Motor: No weakness.     Coordination: Coordination normal.     Gait: Gait normal.  Psychiatric:        Mood and Affect: Mood normal.        Behavior: Behavior normal.        Thought Content: Thought content normal.        Judgment: Judgment normal.     BP (!) 143/62   Pulse 72   Temp 98.4 F (36.9 C) (Oral)   Wt 173 lb 6.4 oz (78.7 kg)   SpO2 98%   BMI 33.86 kg/m  Wt Readings from Last 3 Encounters:  11/23/23 173 lb 6.4 oz (78.7 kg)  09/17/23 155 lb (70.3 kg)  05/25/23 171 lb 9.6 oz (77.8 kg)    No results found for: "TSH" Lab Results  Component Value Date   WBC 7.5 07/25/2009   HGB 14.4 07/25/2009   HCT 40.4 07/25/2009   MCV 85.1 07/25/2009   PLT 202 07/25/2009   Lab Results  Component Value Date   NA 140 05/25/2023   K 4.3  05/25/2023   CO2 22 05/25/2023   GLUCOSE 102 (H) 05/25/2023   BUN 11 05/25/2023   CREATININE 0.61 05/25/2023   BILITOT 0.4 05/25/2023   ALKPHOS 92 05/25/2023   AST 23 05/25/2023   ALT 26 05/25/2023   PROT 7.0 05/25/2023   ALBUMIN 4.3 05/25/2023  CALCIUM 9.0 05/25/2023   EGFR 110 05/25/2023   Lab Results  Component Value Date   CHOL 216 (H) 05/25/2023   Lab Results  Component Value Date   HDL 41 05/25/2023   Lab Results  Component Value Date   LDLCALC 149 (H) 05/25/2023   Lab Results  Component Value Date   TRIG 146 05/25/2023   Lab Results  Component Value Date   CHOLHDL 5.3 (H) 05/25/2023   Lab Results  Component Value Date   HGBA1C 5.7 (H) 05/25/2023      Assessment & Plan:   Problem List Items Addressed This Visit       Cardiovascular and Mediastinum   High blood pressure - Primary   Systolic blood pressure is elevated in the office today, but has been previously well-controlled.  Continue lisinopril 20 mg daily Blood pressure check in 1 month, DASH diet and commitment to daily physical activity for a minimum of 30 minutes discussed and encouraged, as a part of hypertension management. The importance of attaining a healthy weight is also discussed.     11/23/2023    8:40 AM 11/23/2023    8:25 AM 11/23/2023    8:21 AM 09/17/2023    2:44 PM 05/25/2023    8:59 AM 06/05/2022   12:59 PM 04/09/2021   11:11 AM  BP/Weight  Systolic BP 143 143 145 132 127 130 120  Diastolic BP 62 65 89 80 59 90 82  Wt. (Lbs)   173.4 155 171.6 180 175.2  BMI   33.86 kg/m2 30.27 kg/m2 33.51 kg/m2             Relevant Orders   Recheck vitals   Basic Metabolic Panel     Other   Obesity (BMI 30-39.9)   Wt Readings from Last 3 Encounters:  11/23/23 173 lb 6.4 oz (78.7 kg)  09/17/23 155 lb (70.3 kg)  05/25/23 171 lb 9.6 oz (77.8 kg)   Body mass index is 33.86 kg/m.  Patient counseled on low-carb diet Encouraged engage in regular moderate exercises at least 150 minutes  weekly as tolerated Benefits of healthy weights discussed      Mixed hyperlipidemia   Lab Results  Component Value Date   CHOL 216 (H) 05/25/2023   HDL 41 05/25/2023   LDLCALC 149 (H) 05/25/2023   TRIG 146 05/25/2023   CHOLHDL 5.3 (H) 05/25/2023  Avoid fatty fried foods, lose weight      Prediabetes   Lab Results  Component Value Date   HGBA1C 5.7 (H) 05/25/2023          No orders of the defined types were placed in this encounter.   Follow-up: Return in about 6 months (around 05/25/2024) for HTN.    Donell Beers, FNP

## 2023-11-24 LAB — BASIC METABOLIC PANEL
BUN/Creatinine Ratio: 20 (ref 9–23)
BUN: 12 mg/dL (ref 6–24)
CO2: 22 mmol/L (ref 20–29)
Calcium: 9.4 mg/dL (ref 8.7–10.2)
Chloride: 104 mmol/L (ref 96–106)
Creatinine, Ser: 0.6 mg/dL (ref 0.57–1.00)
Glucose: 109 mg/dL — ABNORMAL HIGH (ref 70–99)
Potassium: 4.4 mmol/L (ref 3.5–5.2)
Sodium: 142 mmol/L (ref 134–144)
eGFR: 109 mL/min/{1.73_m2} (ref >59)

## 2023-12-21 ENCOUNTER — Ambulatory Visit (INDEPENDENT_AMBULATORY_CARE_PROVIDER_SITE_OTHER): Payer: Self-pay

## 2023-12-21 VITALS — BP 121/80 | HR 80 | Temp 97.0°F | Wt 166.4 lb

## 2023-12-21 DIAGNOSIS — I10 Essential (primary) hypertension: Secondary | ICD-10-CM

## 2023-12-21 NOTE — Progress Notes (Signed)
   Blood Pressure Recheck Visit  Name: Lubertha Leite MRN: [id number] Date of Birth: Dec 18, 1975  Kamyiah Colantonio presents today for Blood Pressure recheck with clinical support staff.  Order for BP recheck by Folashade P. , ordered on 11/23/23.   BP Readings from Last 3 Encounters:  12/21/23 131/66  11/23/23 (!) 143/62  09/17/23 132/80    Current Outpatient Medications  Medication Sig Dispense Refill   lisinopril  (ZESTRIL ) 20 MG tablet Take 1 tablet by mouth once daily 90 tablet 0   No current facility-administered medications for this visit.    Hypertensive Medication Review: Patient states that they are taking all their hypertensive medications as prescribed and their last dose of hypertensive medications was yesterday   Documentation of any medication adherence discrepancies: none  Provider Recommendation:  Spoke to Folashade Paseda, NP and they stated: continue on present medication and follow up in sept.   Patient has been scheduled to follow up with 05/25/24 with Folashade Pasdea   Patient has been given provider's recommendations and does not have any questions or concerns at this time. Patient will contact the office for any future questions or concerns.   Julian Obey RMA

## 2024-02-10 ENCOUNTER — Other Ambulatory Visit: Payer: Self-pay | Admitting: Nurse Practitioner

## 2024-02-10 DIAGNOSIS — I10 Essential (primary) hypertension: Secondary | ICD-10-CM

## 2024-05-08 ENCOUNTER — Other Ambulatory Visit: Payer: Self-pay | Admitting: Nurse Practitioner

## 2024-05-08 DIAGNOSIS — I10 Essential (primary) hypertension: Secondary | ICD-10-CM

## 2024-05-25 ENCOUNTER — Ambulatory Visit: Payer: Self-pay | Admitting: Nurse Practitioner

## 2024-06-06 ENCOUNTER — Ambulatory Visit (INDEPENDENT_AMBULATORY_CARE_PROVIDER_SITE_OTHER): Payer: Self-pay | Admitting: Nurse Practitioner

## 2024-06-06 ENCOUNTER — Encounter: Payer: Self-pay | Admitting: Nurse Practitioner

## 2024-06-06 VITALS — BP 120/76 | HR 79 | Temp 97.0°F | Wt 165.2 lb

## 2024-06-06 DIAGNOSIS — Z1159 Encounter for screening for other viral diseases: Secondary | ICD-10-CM

## 2024-06-06 DIAGNOSIS — H9201 Otalgia, right ear: Secondary | ICD-10-CM

## 2024-06-06 DIAGNOSIS — I10 Essential (primary) hypertension: Secondary | ICD-10-CM

## 2024-06-06 DIAGNOSIS — H6121 Impacted cerumen, right ear: Secondary | ICD-10-CM | POA: Insufficient documentation

## 2024-06-06 DIAGNOSIS — E785 Hyperlipidemia, unspecified: Secondary | ICD-10-CM

## 2024-06-06 DIAGNOSIS — R7303 Prediabetes: Secondary | ICD-10-CM

## 2024-06-06 DIAGNOSIS — Z114 Encounter for screening for human immunodeficiency virus [HIV]: Secondary | ICD-10-CM

## 2024-06-06 DIAGNOSIS — E669 Obesity, unspecified: Secondary | ICD-10-CM

## 2024-06-06 DIAGNOSIS — Z0000 Encounter for general adult medical examination without abnormal findings: Secondary | ICD-10-CM | POA: Insufficient documentation

## 2024-06-06 DIAGNOSIS — H9202 Otalgia, left ear: Secondary | ICD-10-CM | POA: Insufficient documentation

## 2024-06-06 DIAGNOSIS — Z1211 Encounter for screening for malignant neoplasm of colon: Secondary | ICD-10-CM

## 2024-06-06 MED ORDER — LISINOPRIL 20 MG PO TABS
20.0000 mg | ORAL_TABLET | Freq: Every day | ORAL | 1 refills | Status: AC
Start: 2024-06-06 — End: ?

## 2024-06-06 NOTE — Assessment & Plan Note (Signed)
  Conducted comprehensive wellness visit. Emphasized regular screenings, vaccinations, heart-healthy diet, and exercise. - Order Cologuard for colon cancer screening. - Advise to get flu, shingles, pneumonia, and Tdap vaccines at the pharmacy. - Encourage regular exercise and a heart-healthy diet. - Advise to drink at least 64 ounces of water daily. - Remind to use seatbelt when riding in a car.

## 2024-06-06 NOTE — Assessment & Plan Note (Signed)
 Lab Results  Component Value Date   CHOL 216 (H) 05/25/2023   HDL 41 05/25/2023   LDLCALC 149 (H) 05/25/2023   TRIG 146 05/25/2023   CHOLHDL 5.3 (H) 05/25/2023  Rechecking labs

## 2024-06-06 NOTE — Assessment & Plan Note (Signed)
 Wt Readings from Last 3 Encounters:  06/06/24 165 lb 3.2 oz (74.9 kg)  12/21/23 166 lb 6.4 oz (75.5 kg)  11/23/23 173 lb 6.4 oz (78.7 kg)   Body mass index is 32.26 kg/m.  Patient counseled on low-carb high-protein diet Encouraged moderate vigorous exercise at least 150 minutes weekly as tolerated

## 2024-06-06 NOTE — Assessment & Plan Note (Signed)
 Lab Results  Component Value Date   HGBA1C 5.7 (H) 05/25/2023  Avoid sugar sweets soda Rechecking labs

## 2024-06-06 NOTE — Patient Instructions (Addendum)
 OK to use Debrox (peroxide) in the ear to loosen up wax.  Do not use Q-tips as this can impact wax further.    It is important that you exercise regularly at least 30 minutes 5 times a week as tolerated  Think about what you will eat, plan ahead. Choose " clean, green, fresh or frozen" over canned, processed or packaged foods which are more sugary, salty and fatty. 70 to 75% of food eaten should be vegetables and fruit. Three meals at set times with snacks allowed between meals, but they must be fruit or vegetables. Aim to eat over a 12 hour period , example 7 am to 7 pm, and STOP after  your last meal of the day. Drink water,generally about 64 ounces per day, no other drink is as healthy. Fruit juice is best enjoyed in a healthy way, by EATING the fruit.  Thanks for choosing [facility] we consider it a privelige to serve you.

## 2024-06-06 NOTE — Assessment & Plan Note (Signed)
 The CMA attempted flushing the left ear without success I discussed referral to ENT but the patient would want to try Debrox first Advised to purchase medication over-the-counter and avoid use of Q-tips

## 2024-06-06 NOTE — Progress Notes (Signed)
 Established Patient Office Visit  Subjective:  Patient ID: Becky Ruiz, female    DOB: 12-21-1975  Age: 48 y.o. MRN: [id number]  CC:  Chief Complaint  Patient presents with   Hypertension    HPI   Discussed the use of AI scribe software for clinical note transcription with the patient, who gave verbal consent to proceed.  History of Present Illness Becky Ruiz is a 48 year old female with hypertension and prediabetes who presents for a CPE  She manages her hypertension with lisinopril  20 mg daily. She maintains an active lifestyle by attending the gym two to three times a week and is mindful of her diet, though she does not follow a specific plan.  She has a history of prediabetes with an A1c of 5.7% noted last year. She is interested in further diabetes testing.  She experiences mild pain in her right ear for about a month, with no associated drainage. She avoids cleaning her ear due to fear of injury.    Her last Pap smear was over a year ago and was normal. She lives with her husband and children and does not smoke, vape, or drink alcohol.  Up-to-date with mammogram test was normal  Interpretation services provided by medical interpreter  Assessment & Plan       Past Medical History:  Diagnosis Date   Hypertension     Past Surgical History:  Procedure Laterality Date   BREAST BIOPSY Left 04/12/2021   fibroadenoma   CHOLECYSTECTOMY     HERNIA REPAIR      Family History  Problem Relation Age of Onset   Breast cancer Neg Hx     Social History   Socioeconomic History   Marital status: Married    Spouse name: Not on file   Number of children: 4   Years of education: Not on file   Highest education level: 6th grade  Occupational History   Not on file  Tobacco Use   Smoking status: Never   Smokeless tobacco: Never  Vaping Use   Vaping status: Never Used  Substance and Sexual Activity   Alcohol use: Not Currently    Comment:  rarely   Drug use: Never   Sexual activity: Yes    Birth control/protection: Condom  Other Topics Concern   Not on file  Social History Narrative   Pt was born in [place]   Lives with her spouse    Social Drivers of Health   Financial Resource Strain: Not on file  Food Insecurity: No Food Insecurity (09/17/2023)   Hunger Vital Sign    Worried About Running Out of Food in the Last Year: Never true    Ran Out of Food in the Last Year: Never true  Transportation Needs: No Transportation Needs (09/17/2023)   PRAPARE - Administrator, Civil Service (Medical): No    Lack of Transportation (Non-Medical): No  Physical Activity: Not on file  Stress: Not on file  Social Connections: Not on file  Intimate Partner Violence: Not on file    Outpatient Medications Prior to Visit  Medication Sig Dispense Refill   lisinopril  (ZESTRIL ) 20 MG tablet Take 1 tablet by mouth once daily 90 tablet 0   No facility-administered medications prior to visit.    No Known Allergies  ROS Review of Systems  Constitutional:  Negative for appetite change, chills, fatigue and fever.  HENT:  Positive for ear pain. Negative for congestion, postnasal drip, rhinorrhea and sneezing.  Eyes:  Negative for pain, discharge and itching.  Respiratory:  Negative for cough, shortness of breath and wheezing.   Cardiovascular:  Negative for chest pain, palpitations and leg swelling.  Gastrointestinal:  Negative for abdominal pain, constipation, nausea and vomiting.  Endocrine: Negative for cold intolerance, heat intolerance and polydipsia.  Genitourinary:  Negative for difficulty urinating, dysuria, flank pain and frequency.  Musculoskeletal:  Negative for arthralgias, back pain, joint swelling and myalgias.  Skin:  Negative for color change, pallor, rash and wound.  Allergic/Immunologic: Negative for food allergies and immunocompromised state.  Neurological:  Negative for dizziness, facial asymmetry,  weakness, numbness and headaches.  Psychiatric/Behavioral:  Negative for behavioral problems, confusion, self-injury and suicidal ideas.       Objective:    Physical Exam Vitals and nursing note reviewed.  Constitutional:      General: She is not in acute distress.    Appearance: Normal appearance. She is obese. She is not ill-appearing, toxic-appearing or diaphoretic.  HENT:     Right Ear: Ear canal and external ear normal. There is impacted cerumen.     Left Ear: Tympanic membrane, ear canal and external ear normal. There is no impacted cerumen.     Nose: Nose normal. No congestion or rhinorrhea.     Mouth/Throat:     Mouth: Mucous membranes are moist.     Pharynx: Oropharynx is clear. No oropharyngeal exudate or posterior oropharyngeal erythema.  Eyes:     General: No scleral icterus.       Right eye: No discharge.        Left eye: No discharge.     Extraocular Movements: Extraocular movements intact.     Conjunctiva/sclera: Conjunctivae normal.  Neck:     Vascular: No carotid bruit.  Cardiovascular:     Rate and Rhythm: Normal rate and regular rhythm.     Pulses: Normal pulses.     Heart sounds: Normal heart sounds. No murmur heard.    No friction rub. No gallop.  Pulmonary:     Effort: Pulmonary effort is normal. No respiratory distress.     Breath sounds: Normal breath sounds. No stridor. No wheezing, rhonchi or rales.  Chest:     Chest wall: No tenderness.  Abdominal:     General: Bowel sounds are normal. There is no distension.     Palpations: Abdomen is soft. There is no mass.     Tenderness: There is no abdominal tenderness. There is no right CVA tenderness, left CVA tenderness, guarding or rebound.     Hernia: No hernia is present.  Musculoskeletal:        General: No swelling, tenderness, deformity or signs of injury.     Cervical back: Normal range of motion and neck supple. No rigidity or tenderness.     Right lower leg: No edema.     Left lower leg: No  edema.  Lymphadenopathy:     Cervical: No cervical adenopathy.  Skin:    General: Skin is warm and dry.     Capillary Refill: Capillary refill takes less than 2 seconds.     Coloration: Skin is not jaundiced or pale.     Findings: No bruising, erythema, lesion or rash.  Neurological:     Mental Status: She is alert and oriented to person, place, and time.     Cranial Nerves: No cranial nerve deficit.     Motor: No weakness.     Gait: Gait normal.  Psychiatric:  Mood and Affect: Mood normal.        Behavior: Behavior normal.        Thought Content: Thought content normal.        Judgment: Judgment normal.     BP 120/76   Pulse 79   Temp (!) 97 F (36.1 C)   Wt 165 lb 3.2 oz (74.9 kg)   SpO2 95%   BMI 32.26 kg/m  Wt Readings from Last 3 Encounters:  06/06/24 165 lb 3.2 oz (74.9 kg)  12/21/23 166 lb 6.4 oz (75.5 kg)  11/23/23 173 lb 6.4 oz (78.7 kg)    No results found for: TSH Lab Results  Component Value Date   WBC 7.5 07/25/2009   HGB 14.4 07/25/2009   HCT 40.4 07/25/2009   MCV 85.1 07/25/2009   PLT 202 07/25/2009   Lab Results  Component Value Date   NA 142 11/23/2023   K 4.4 11/23/2023   CO2 22 11/23/2023   GLUCOSE 109 (H) 11/23/2023   BUN 12 11/23/2023   CREATININE 0.60 11/23/2023   BILITOT 0.4 05/25/2023   ALKPHOS 92 05/25/2023   AST 23 05/25/2023   ALT 26 05/25/2023   PROT 7.0 05/25/2023   ALBUMIN 4.3 05/25/2023   CALCIUM 9.4 11/23/2023   EGFR 109 11/23/2023   Lab Results  Component Value Date   CHOL 216 (H) 05/25/2023   Lab Results  Component Value Date   HDL 41 05/25/2023   Lab Results  Component Value Date   LDLCALC 149 (H) 05/25/2023   Lab Results  Component Value Date   TRIG 146 05/25/2023   Lab Results  Component Value Date   CHOLHDL 5.3 (H) 05/25/2023   Lab Results  Component Value Date   HGBA1C 5.7 (H) 05/25/2023      Assessment & Plan:   Problem List Items Addressed This Visit       Cardiovascular and  Mediastinum   High blood pressure - Primary   BP Readings from Last 3 Encounters:  06/06/24 120/76  12/21/23 121/80  11/23/23 (!) 143/62   HTN Controlled .  Continue lisinopril  20 mg daily Continue current medications. No changes in management. Discussed DASH diet and dietary sodium restrictions Continue to increase dietary efforts and exercise.         Relevant Orders   CBC   CMP14+EGFR     Nervous and Auditory   Impacted cerumen, right ear   The CMA attempted flushing the left ear without success I discussed referral to ENT but the patient would want to try Debrox first Advised to purchase medication over-the-counter and avoid use of Q-tips        Other   Obesity (BMI 30-39.9)   Mixed hyperlipidemia   Lab Results  Component Value Date   CHOL 216 (H) 05/25/2023   HDL 41 05/25/2023   LDLCALC 149 (H) 05/25/2023   TRIG 146 05/25/2023   CHOLHDL 5.3 (H) 05/25/2023  Rechecking labs      Prediabetes   Lab Results  Component Value Date   HGBA1C 5.7 (H) 05/25/2023  Avoid sugar sweets soda Rechecking labs      Relevant Orders   Hemoglobin A1c   Annual physical exam    Conducted comprehensive wellness visit. Emphasized regular screenings, vaccinations, heart-healthy diet, and exercise. - Order Cologuard for colon cancer screening. - Advise to get flu, shingles, pneumonia, and Tdap vaccines at the pharmacy. - Encourage regular exercise and a heart-healthy diet. - Advise to drink at least 64  ounces of water daily. - Remind to use seatbelt when riding in a car.      RESOLVED: Ear pain, left   Other Visit Diagnoses       Hyperlipidemia, unspecified hyperlipidemia type       Relevant Orders   Lipid panel     Screening for colon cancer       Relevant Orders   Cologuard     Need for hepatitis C screening test       Relevant Orders   Hepatitis C antibody     Encounter for special screening examination for HIV       Relevant Orders   HIV Antibody (routine  testing w rflx)       No orders of the defined types were placed in this encounter.   Follow-up: Return in about 6 months (around 12/05/2024) for HTN, FASTING LABS THIS WEEK.    Rhondalyn Clingan R Caylin Nass, FNP

## 2024-06-06 NOTE — Assessment & Plan Note (Signed)
 BP Readings from Last 3 Encounters:  06/06/24 120/76  12/21/23 121/80  11/23/23 (!) 143/62   HTN Controlled .  Continue lisinopril  20 mg daily Continue current medications. No changes in management. Discussed DASH diet and dietary sodium restrictions Continue to increase dietary efforts and exercise.

## 2024-06-20 ENCOUNTER — Other Ambulatory Visit: Payer: Self-pay

## 2024-06-20 DIAGNOSIS — I10 Essential (primary) hypertension: Secondary | ICD-10-CM

## 2024-06-20 DIAGNOSIS — R7303 Prediabetes: Secondary | ICD-10-CM

## 2024-06-20 DIAGNOSIS — Z114 Encounter for screening for human immunodeficiency virus [HIV]: Secondary | ICD-10-CM

## 2024-06-20 DIAGNOSIS — Z1159 Encounter for screening for other viral diseases: Secondary | ICD-10-CM

## 2024-06-20 DIAGNOSIS — E785 Hyperlipidemia, unspecified: Secondary | ICD-10-CM

## 2024-06-21 ENCOUNTER — Ambulatory Visit: Payer: Self-pay | Admitting: Nurse Practitioner

## 2024-06-21 LAB — CMP14+EGFR
ALT: 24 IU/L (ref 0–32)
AST: 21 IU/L (ref 0–40)
Albumin: 4.4 g/dL (ref 3.9–4.9)
Alkaline Phosphatase: 89 IU/L (ref 41–116)
BUN/Creatinine Ratio: 18 (ref 9–23)
BUN: 12 mg/dL (ref 6–24)
Bilirubin Total: 0.4 mg/dL (ref 0.0–1.2)
CO2: 24 mmol/L (ref 20–29)
Calcium: 9.5 mg/dL (ref 8.7–10.2)
Chloride: 103 mmol/L (ref 96–106)
Creatinine, Ser: 0.65 mg/dL (ref 0.57–1.00)
Globulin, Total: 2.6 g/dL (ref 1.5–4.5)
Glucose: 98 mg/dL (ref 70–99)
Potassium: 4.2 mmol/L (ref 3.5–5.2)
Sodium: 141 mmol/L (ref 134–144)
Total Protein: 7 g/dL (ref 6.0–8.5)
eGFR: 107 mL/min/1.73 (ref >59)

## 2024-06-21 LAB — CBC
Hematocrit: 42.9 % (ref 34.0–46.6)
Hemoglobin: 14 g/dL (ref 11.1–15.9)
MCH: 29.5 pg (ref 26.6–33.0)
MCHC: 32.6 g/dL (ref 31.5–35.7)
MCV: 91 fL (ref 79–97)
Platelets: 163 x10E3/uL (ref 150–450)
RBC: 4.74 x10E6/uL (ref 3.77–5.28)
RDW: 12.8 % (ref 11.7–15.4)
WBC: 7.5 x10E3/uL (ref 3.4–10.8)

## 2024-06-21 LAB — HEMOGLOBIN A1C
Est. average glucose Bld gHb Est-mCnc: 117 mg/dL
Hgb A1c MFr Bld: 5.7 % — ABNORMAL HIGH (ref 4.8–5.6)

## 2024-06-21 LAB — LIPID PANEL
Chol/HDL Ratio: 4.6 ratio — ABNORMAL HIGH (ref 0.0–4.4)
Cholesterol, Total: 204 mg/dL — ABNORMAL HIGH (ref 100–199)
HDL: 44 mg/dL (ref >39)
LDL Chol Calc (NIH): 137 mg/dL — ABNORMAL HIGH (ref 0–99)
Triglycerides: 127 mg/dL (ref 0–149)
VLDL Cholesterol Cal: 23 mg/dL (ref 5–40)

## 2024-06-21 LAB — HIV ANTIBODY (ROUTINE TESTING W REFLEX): HIV Screen 4th Generation wRfx: NONREACTIVE

## 2024-06-21 LAB — HEPATITIS C ANTIBODY: Hep C Virus Ab: NONREACTIVE

## 2024-07-01 LAB — COLOGUARD: COLOGUARD: NEGATIVE

## 2024-09-15 ENCOUNTER — Other Ambulatory Visit: Payer: Self-pay | Admitting: Obstetrics and Gynecology

## 2024-09-15 DIAGNOSIS — Z1231 Encounter for screening mammogram for malignant neoplasm of breast: Secondary | ICD-10-CM

## 2024-11-17 ENCOUNTER — Ambulatory Visit

## 2024-11-17 ENCOUNTER — Encounter

## 2024-12-05 ENCOUNTER — Ambulatory Visit: Payer: Self-pay | Admitting: Nurse Practitioner
